# Patient Record
Sex: Male | Born: 1990 | Race: White | Hispanic: No | Marital: Single | Smoking: Current every day smoker
Health system: Southern US, Community
[De-identification: ages and names within clinical notes are randomized; demographics above are authoritative.]

---
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
## 2015-02-12 ENCOUNTER — Encounter (HOSPITAL_COMMUNITY): Payer: Self-pay

## 2015-02-12 ENCOUNTER — Emergency Department (HOSPITAL_COMMUNITY)
Admission: EM | Admit: 2015-02-12 | Discharge: 2015-02-12 | Disposition: A | Discharge status: Home / Self Care | Payer: Self-pay | Attending: Emergency Medicine | Admitting: Emergency Medicine

## 2015-02-12 DIAGNOSIS — Z720 Tobacco use: Secondary | ICD-10-CM | POA: Insufficient documentation

## 2015-02-12 DIAGNOSIS — Y9389 Activity, other specified: Secondary | ICD-10-CM | POA: Insufficient documentation

## 2015-02-12 DIAGNOSIS — T7840XA Allergy, unspecified, initial encounter: Secondary | ICD-10-CM

## 2015-02-12 DIAGNOSIS — T63441A Toxic effect of venom of bees, accidental (unintentional), initial encounter: Secondary | ICD-10-CM | POA: Insufficient documentation

## 2015-02-12 DIAGNOSIS — Y9289 Other specified places as the place of occurrence of the external cause: Secondary | ICD-10-CM | POA: Insufficient documentation

## 2015-02-12 DIAGNOSIS — Y998 Other external cause status: Secondary | ICD-10-CM | POA: Insufficient documentation

## 2015-02-12 MED ORDER — FAMOTIDINE IN NACL 20-0.9 MG/50ML-% IV SOLN
20.0000 mg | Freq: Once | INTRAVENOUS | Status: AC
  Administered 2015-02-12: 20 mg via INTRAVENOUS
  Filled 2015-02-12: qty 50

## 2015-02-12 MED ORDER — DIPHENHYDRAMINE HCL 50 MG/ML IJ SOLN
25.0000 mg | Freq: Once | INTRAMUSCULAR | Status: AC
  Administered 2015-02-12: 25 mg via INTRAVENOUS
  Filled 2015-02-12: qty 1

## 2015-02-12 MED ORDER — METHYLPREDNISOLONE SODIUM SUCC 125 MG IJ SOLR
125.0000 mg | Freq: Once | INTRAMUSCULAR | Status: AC
  Administered 2015-02-12: 125 mg via INTRAVENOUS
  Filled 2015-02-12: qty 2

## 2015-02-12 NOTE — ED Provider Notes (Signed)
CSN: [id number]     Arrival date & time 02/12/15  1919 History   First MD Initiated Contact with Patient 02/12/15 1935     Chief Complaint  Patient presents with  . Allergic Reaction      HPI  Vision presents for evaluation after a bee sting. He was stung on his head, and his left hand 30 this report arrival. States he felt swelling in his upper lip.  Some itching in his left hand. No difficulty swallowing or breathing and no stridor. Not dizzy or lightheaded. His never had significant hymenoptera evenomation or anaphylaxis.  History reviewed. No pertinent past medical history. History reviewed. No pertinent past surgical history. No family history on file. History  Substance Use Topics  . Smoking status: Current Every Day Smoker  . Smokeless tobacco: Not on file  . Alcohol Use: Yes    Review of Systems  Constitutional: Negative for fever, chills, diaphoresis, appetite change and fatigue.  HENT: Negative for mouth sores, sore throat and trouble swallowing.        No obvious asymmetry of the upper and lower lip. No swelling. Tongue normal pharynx normal to clear lungs no stridor.  Eyes: Negative for visual disturbance.  Respiratory: Negative for cough, chest tightness, shortness of breath and wheezing.   Cardiovascular: Negative for chest pain.  Gastrointestinal: Negative for nausea, vomiting, abdominal pain, diarrhea and abdominal distention.  Endocrine: Negative for polydipsia, polyphagia and polyuria.  Genitourinary: Negative for dysuria, frequency and hematuria.  Musculoskeletal: Negative for gait problem.  Skin: Negative for color change, pallor and rash.       Bee stings to the left hand. Minimal erythema.  Neurological: Negative for dizziness, syncope, light-headedness and headaches.  Hematological: Does not bruise/bleed easily.  Psychiatric/Behavioral: Negative for behavioral problems and confusion.      Allergies  Bee venom  Home Medications   Prior to Admission  medications   Not on File   BP 131/80 mmHg  Pulse 70  Temp(Src) 98 F (36.7 C) (Oral)  Resp 15  Ht 6\' 1"  (1.854 m)  Wt 152 lb (68.947 kg)  BMI 20.06 kg/m2  SpO2 98% Physical Exam  Constitutional: He is oriented to person, place, and time. He appears well-developed and well-nourished. No distress.  HENT:  Head: Normocephalic.  Normal lip, normal pharynx.  Eyes: Conjunctivae are normal. Pupils are equal, round, and reactive to light. No scleral icterus.  Neck: Normal range of motion. Neck supple. No thyromegaly present.  Cardiovascular: Normal rate and regular rhythm.  Exam reveals no gallop and no friction rub.   No murmur heard. Pulmonary/Chest: Effort normal and breath sounds normal. No respiratory distress. He has no wheezes. He has no rales.  Abdominal: Soft. Bowel sounds are normal. He exhibits no distension. There is no tenderness. There is no rebound.  Musculoskeletal: Normal range of motion.  Neurological: He is alert and oriented to person, place, and time.  Skin: Skin is warm and dry. No rash noted.  Psychiatric: He has a normal mood and affect. His behavior is normal.    ED Course  Procedures (including critical care time) Labs Review Labs Reviewed - No data to display  Imaging Review No results found.   EKG Interpretation None      MDM   Final diagnoses:  Allergic reaction, initial encounter   IV placed, patient given meds. On reevaluation he is asymptomatic. Normal airway and oropharynx. No stridor clear lungs no rash. Discharge home. Continue Benadryl when necessary.  Rolland Porter, MD 02/12/15 [phone]

## 2015-02-12 NOTE — Discharge Instructions (Signed)
Benadryl as needed for the next 24 hours. Turn to your with difficulty breathing, swallowing, speaking or other new or worsening symptoms.

## 2015-02-12 NOTE — ED Notes (Signed)
Pt states he was stung 3 times by wasps approx 30 mins ago, states he is swelling in the face and lips, but denies throat swelling.

## 2015-02-12 NOTE — ED Notes (Signed)
Pt alert & oriented x4, stable gait. Patient given discharge instructions, paperwork & prescription(s). Patient  instructed to stop at the registration desk to finish any additional paperwork. Patient verbalized understanding. Pt left department w/ no further questions. 

## 2016-02-24 ENCOUNTER — Encounter (HOSPITAL_COMMUNITY): Payer: Self-pay | Admitting: Emergency Medicine

## 2016-02-24 ENCOUNTER — Emergency Department (HOSPITAL_COMMUNITY)
Admission: EM | Admit: 2016-02-24 | Discharge: 2016-02-24 | Disposition: A | Discharge status: Home / Self Care | Payer: Self-pay | Attending: Emergency Medicine | Admitting: Emergency Medicine

## 2016-02-24 ENCOUNTER — Emergency Department (HOSPITAL_COMMUNITY)
Admit: 2016-02-24 | Discharge: 2016-02-24 | Disposition: A | Discharge status: Home / Self Care | Payer: Self-pay | Attending: Physician Assistant | Admitting: Physician Assistant

## 2016-02-24 DIAGNOSIS — R1011 Right upper quadrant pain: Secondary | ICD-10-CM | POA: Insufficient documentation

## 2016-02-24 DIAGNOSIS — F17210 Nicotine dependence, cigarettes, uncomplicated: Secondary | ICD-10-CM | POA: Insufficient documentation

## 2016-02-24 LAB — CBC
HCT: 48.1 % (ref 39.0–52.0)
Hemoglobin: 16.7 g/dL (ref 13.0–17.0)
MCH: 33.9 pg (ref 26.0–34.0)
MCHC: 34.7 g/dL (ref 30.0–36.0)
MCV: 97.8 fL (ref 78.0–100.0)
PLATELETS: 233 10*3/uL (ref 150–400)
RBC: 4.92 MIL/uL (ref 4.22–5.81)
RDW: 12.6 % (ref 11.5–15.5)
WBC: 8 10*3/uL (ref 4.0–10.5)

## 2016-02-24 LAB — URINALYSIS, ROUTINE W REFLEX MICROSCOPIC
BILIRUBIN URINE: NEGATIVE
Glucose, UA: NEGATIVE mg/dL
Hgb urine dipstick: NEGATIVE
KETONES UR: NEGATIVE mg/dL
Leukocytes, UA: NEGATIVE
NITRITE: NEGATIVE
PH: 7 (ref 5.0–8.0)
Protein, ur: NEGATIVE mg/dL
Specific Gravity, Urine: 1.01 (ref 1.005–1.030)

## 2016-02-24 LAB — LIPASE, BLOOD: Lipase: 19 U/L (ref 11–51)

## 2016-02-24 LAB — COMPREHENSIVE METABOLIC PANEL
ALBUMIN: 4.9 g/dL (ref 3.5–5.0)
ALK PHOS: 64 U/L (ref 38–126)
ALT: 11 U/L — ABNORMAL LOW (ref 17–63)
AST: 19 U/L (ref 15–41)
Anion gap: 2 — ABNORMAL LOW (ref 5–15)
BILIRUBIN TOTAL: 0.5 mg/dL (ref 0.3–1.2)
BUN: 11 mg/dL (ref 6–20)
CALCIUM: 9.8 mg/dL (ref 8.9–10.3)
CO2: 31 mmol/L (ref 22–32)
CREATININE: 0.92 mg/dL (ref 0.61–1.24)
Chloride: 102 mmol/L (ref 101–111)
GFR calc Af Amer: >60 mL/min (ref >60)
GFR calc non Af Amer: >60 mL/min (ref >60)
GLUCOSE: 148 mg/dL — AB (ref 65–99)
Potassium: 4.1 mmol/L (ref 3.5–5.1)
Sodium: 135 mmol/L (ref 135–145)
TOTAL PROTEIN: 7.9 g/dL (ref 6.5–8.1)

## 2016-02-24 MED ORDER — RANITIDINE HCL 150 MG PO TABS: 150.0000 mg | ORAL_TABLET | Freq: Two times a day (BID) | ORAL | 0 refills | Status: AC

## 2016-02-24 MED ORDER — OMEPRAZOLE 20 MG PO CPDR: 20.0000 mg | DELAYED_RELEASE_CAPSULE | Freq: Every day | ORAL | 1 refills | Status: AC

## 2016-02-24 NOTE — ED Notes (Signed)
Pt went over for US.

## 2016-02-24 NOTE — ED Triage Notes (Signed)
Pt reports RUQ pain since last Sunday with nausea after eating, denies v/d.  Pt drinks appx 30 beers every weekend and occasionally has tremors and headache during the week when he does not drink.

## 2016-02-24 NOTE — ED Provider Notes (Addendum)
[facility]-EMERGENCY DEPT Provider Note   CSN: [id number] Arrival date & time: 02/24/16  0847     History   Chief Complaint Chief Complaint  Patient presents with  . Abdominal Pain    HPI Paul Weber is a 25 y.o. male.  Patient is a 25 year old male who presents to the emergency department with a complaint of right upper quadrant abdominal pain.  The patient states this problem started nearly a week ago. He states the pain is mostly at night, it last about 15-30 minutes. The patient seems to be worse with eating. Nothing seems to make the pain any better. There's been no recent injury, operation, or procedure involving the right upper quadrant. The patient is not had this problem in the past. He is not had any vomiting, but has had some nausea. He's had some constipation, but no diarrhea. There's been no fever, there's been no blood in the urine, this been no blood in the stools. The patient states that he is a smoker, he drinks beer 2 times a week, but states he drinks probably 15 beers a day during those 2 days. He denies use of any illicit drugs.    Abdominal Pain      History reviewed. No pertinent past medical history.  There are no active problems to display for this patient.   History reviewed. No pertinent surgical history.     Home Medications    Prior to Admission medications   Not on File    Family History History reviewed. No pertinent family history.  Social History Social History  Substance Use Topics  . Smoking status: Current Every Day Smoker    Packs/day: 1.00  . Smokeless tobacco: Never Used  . Alcohol use Yes     Comment: 30 beers every weekend      Allergies   Bee venom   Review of Systems Review of Systems  Gastrointestinal: Positive for abdominal pain.  All other systems reviewed and are negative.    Physical Exam Updated Vital Signs BP 153/92 (BP Location: Left Arm)   Pulse 94   Temp 97.9 F (36.6 C) (Oral)   Resp 18    Ht 6\' 1"  (1.854 m)   Wt 70.3 kg   SpO2 100%   BMI 20.45 kg/m   Physical Exam  Constitutional: He appears well-developed and well-nourished. No distress.  HENT:  Head: Normocephalic and atraumatic.  Mouth/Throat: Oropharynx is clear and moist. No oropharyngeal exudate.  Eyes: Conjunctivae and EOM are normal. Pupils are equal, round, and reactive to light. Right eye exhibits no discharge. Left eye exhibits no discharge. No scleral icterus.  Neck: Normal range of motion. Neck supple. No JVD present. No thyromegaly present.  Cardiovascular: Normal rate, regular rhythm, normal heart sounds and intact distal pulses.  Exam reveals no gallop and no friction rub.   No murmur heard. Pulmonary/Chest: Effort normal and breath sounds normal. No respiratory distress. He has no wheezes. He has no rales.  Abdominal: Soft. Bowel sounds are normal. He exhibits no distension and no mass. There is no tenderness.  Musculoskeletal: Normal range of motion. He exhibits no edema or tenderness.  Lymphadenopathy:    He has no cervical adenopathy.  Neurological: He is alert. Coordination normal.  Skin: Skin is warm and dry. No rash noted. No erythema.  Psychiatric: He has a normal mood and affect. His behavior is normal.  Nursing note and vitals reviewed.    ED Treatments / Results  Labs (all labs ordered are  listed, but only abnormal results are displayed) Labs Reviewed  COMPREHENSIVE METABOLIC PANEL - Abnormal; Notable for the following:       Result Value   Glucose, Bld 148 (*)    ALT 11 (*)    Anion gap 2 (*)    All other components within normal limits  LIPASE, BLOOD  CBC  URINALYSIS, ROUTINE W REFLEX MICROSCOPIC (NOT AT [facility])    EKG  EKG Interpretation None       Radiology No results found.  Procedures Procedures (including critical care time)  Medications Ordered in ED Medications - No data to display   Initial Impression / Assessment and Plan / ED Course  I have reviewed the  triage vital signs and the nursing notes.  Pertinent labs & imaging results that were available during my care of the patient were reviewed by me and considered in my medical decision making (see chart for details).  Clinical Course  Comment By Time  The patient has a history of alcohol intake, he states after he drinks alcohol the next morning he has a burning sensation in the right upper quadrant. He has nausea with this, sometimes she has nausea after eating in the right upper quadrant, at this time he has no tenderness on my exam, his vital signs are normal, he is no longer tachycardic, his labs are unremarkable with no leukocytosis, normal liver function, normal lipase and a clean urinalysis. Ultrasound of the right upper quadrant was performed and shows no signs of cholecystitis. The patient was informed of all of these results by myself, he is stable for discharge. He is agreeable to decrease his drinking, we will start on antihistamine Eber HongBrian Sharmain Lastra, MD 08/20 1125    **I have reviewed nursing notes, vital signs, and all appropriate lab and imaging results for this patient.*  Final Clinical Impressions(s) / ED Diagnoses  Vital signs within normal limits with exception of blood pressure being 116/99. Urinalysis is within normal limits. Competence of metabolic panel shows the glucose to be 148, otherwise within normal limits. The lipase is normal at 19, and the complete blood count is well within normal limits. The abdominal ultrasound is negative for acute event.  The patient will be placed on Zantac. He is referred to gastroenterology for additional evaluation concerning his abdominal pain. Test results discussed with the patient by Dr. Hyacinth MeekerMiller . Patient will return sooner if any changes or problems.    Final diagnoses:  None    New Prescriptions New Prescriptions   No medications on file      Ivery QualeHobson Bryant, PA-C 02/24/16 1701    Eber HongBrian Tadhg Eskew, MD 02/25/16 [id number]    Eber HongBrian Oluwatobiloba Martin,  MD 03/26/16 1021

## 2016-02-24 NOTE — Discharge Instructions (Addendum)
Your vital signs within normal limits, with exception of your blood pressure being 153/92. Your oxygen level is 100% on room air. Your urine test is negative for acute problem. Your lab work is negative for acute issue. Please call Dr. Darrick Pennafields, stomach specialist, for additional evaluation of your right upper quadrant pain. ;Your ultrasound test is negative.

## 2017-02-15 IMAGING — US US ABDOMEN LIMITED
1 series · 14 of 25 positions shown · non-contrast
Comparison: None.

CLINICAL DATA: Right upper quadrant pain, 1 week duration.

EXAM:
US ABDOMEN LIMITED - RIGHT UPPER QUADRANT

[Series 1: us abdomen limited · 0.15mm/px · 14 of 54 slices shown]
[im 1/54]
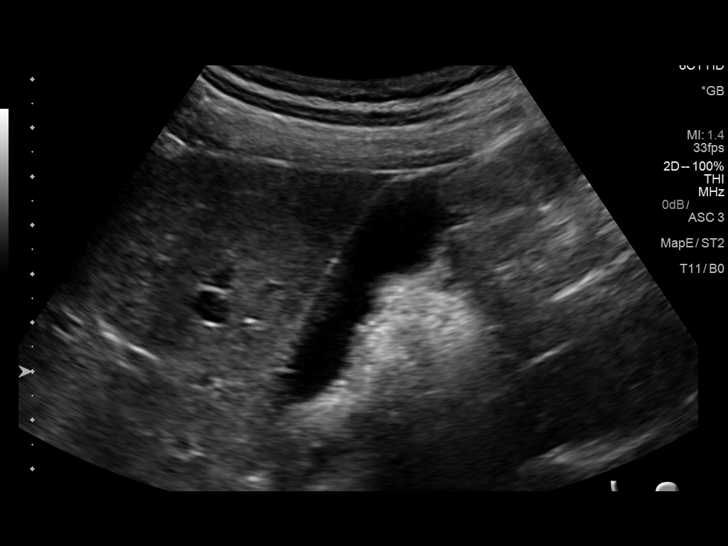
[im 5/54]
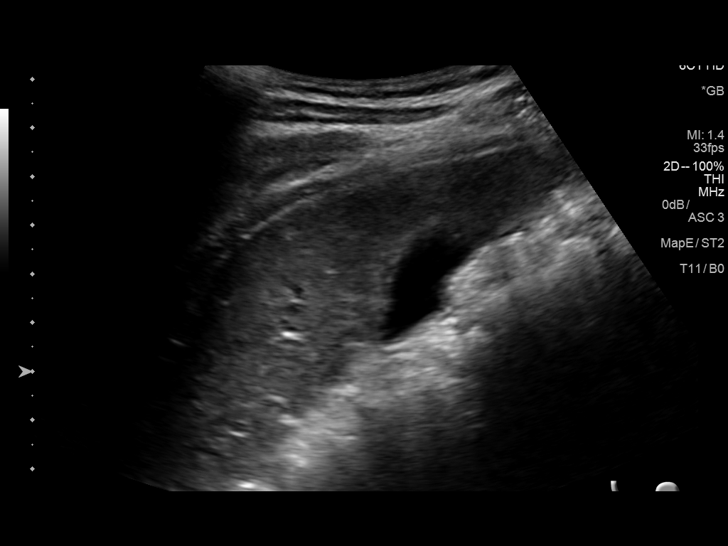
[im 9/54]
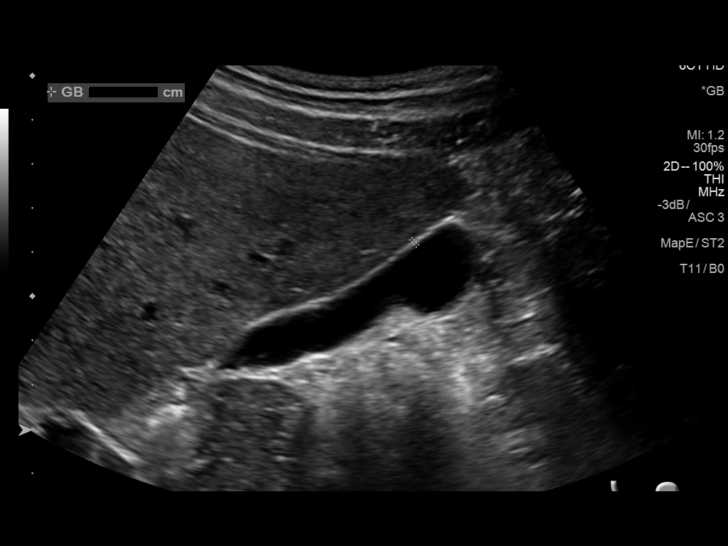
[im 14/54]
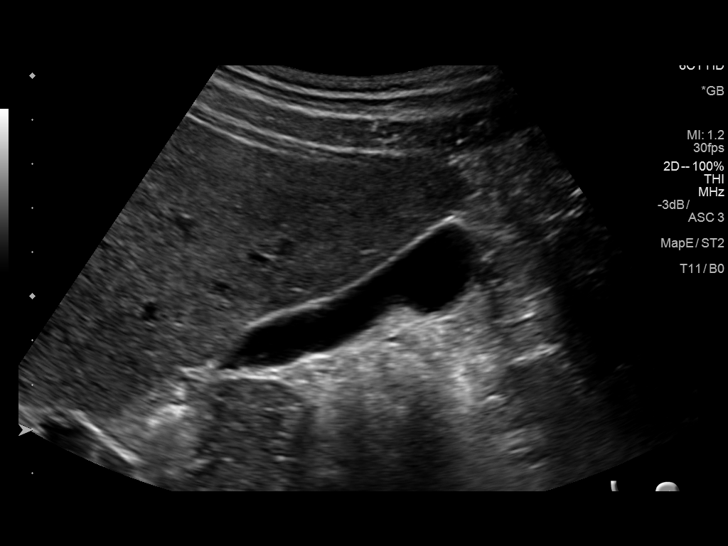
[im 18/54]
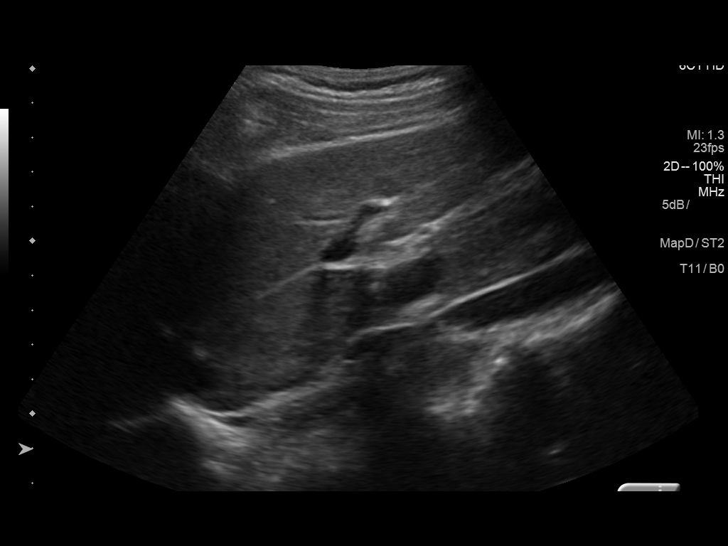
[im 20/54]
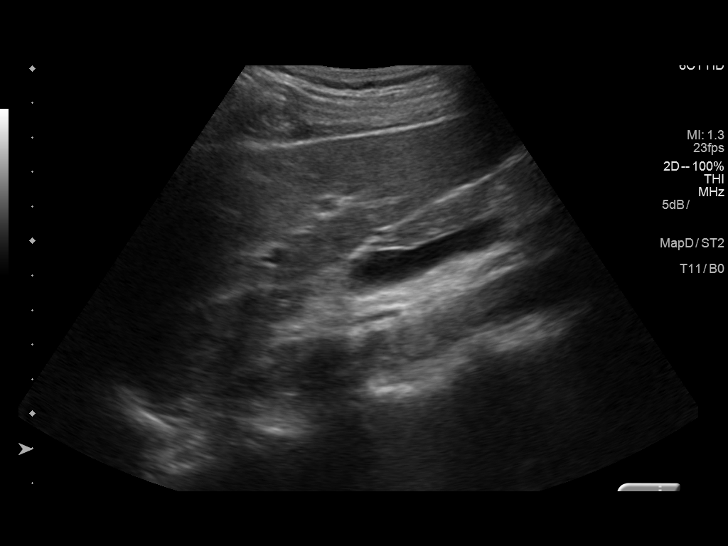
[im 25/54]
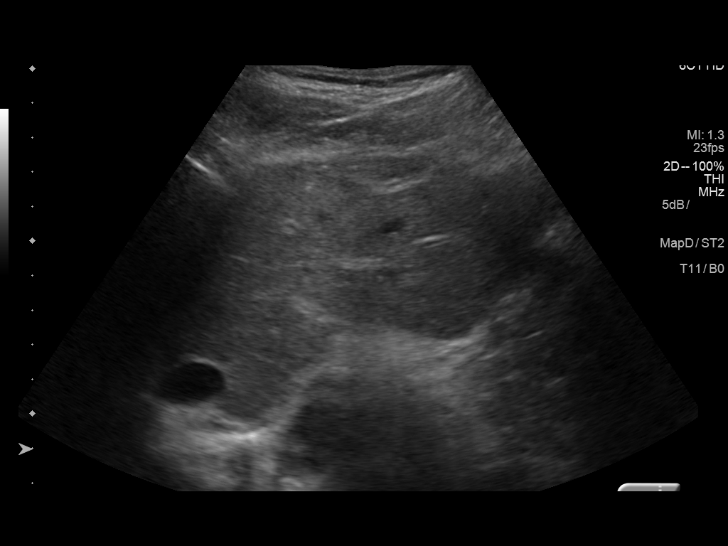
[im 29/54]
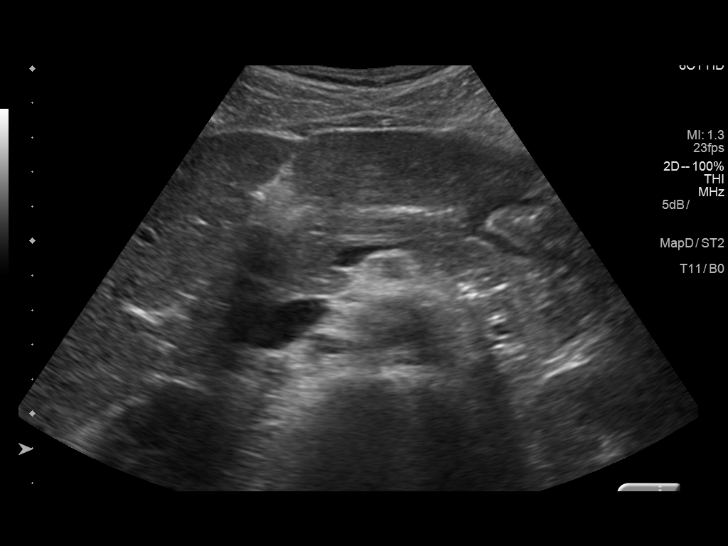
[im 34/54]
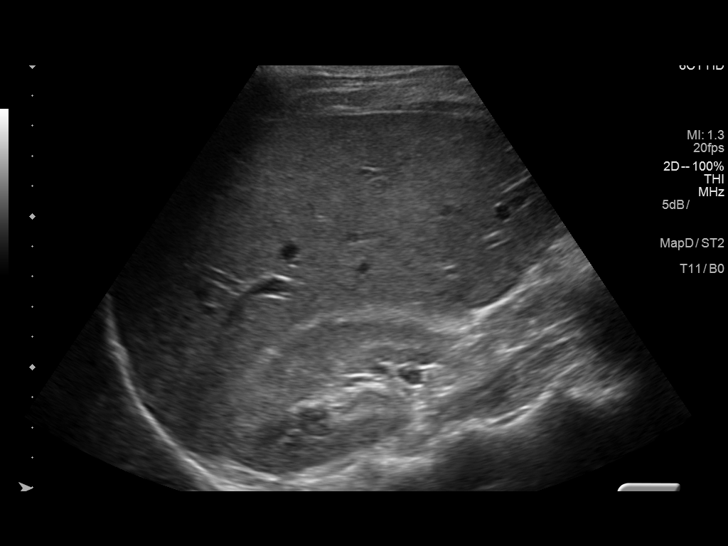
[im 36/54]
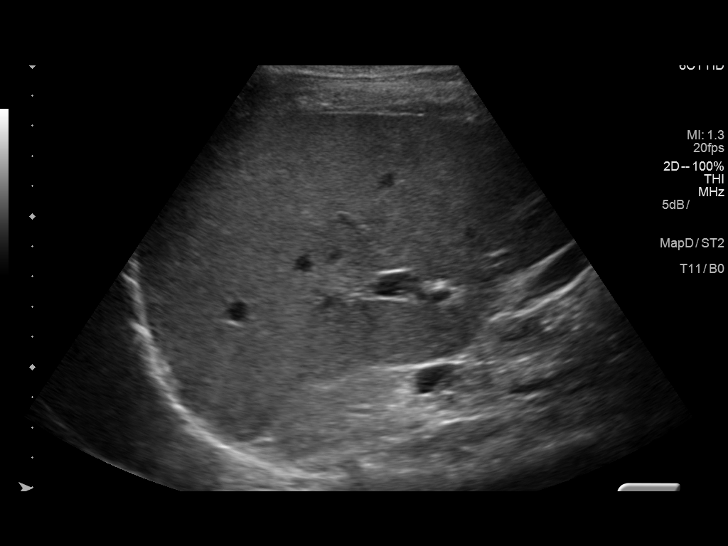
[im 40/54]
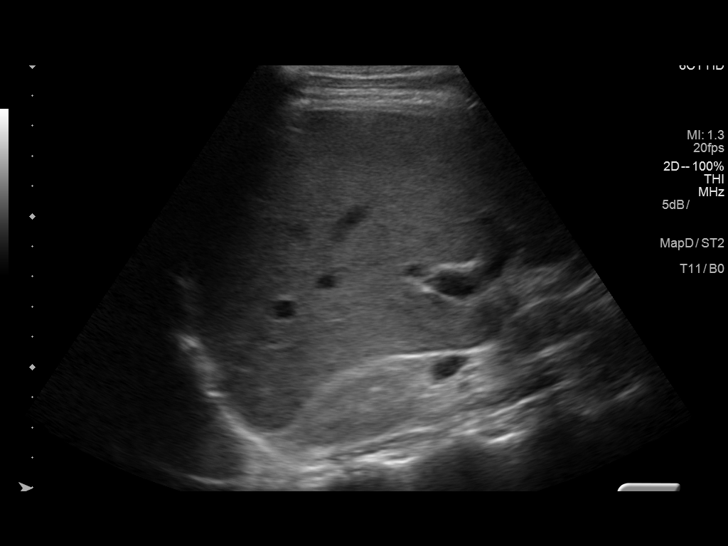
[im 45/54]
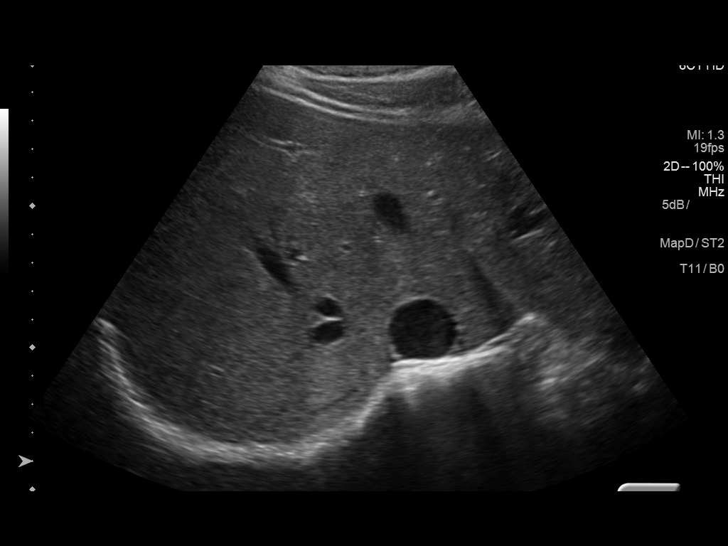
[im 49/54]
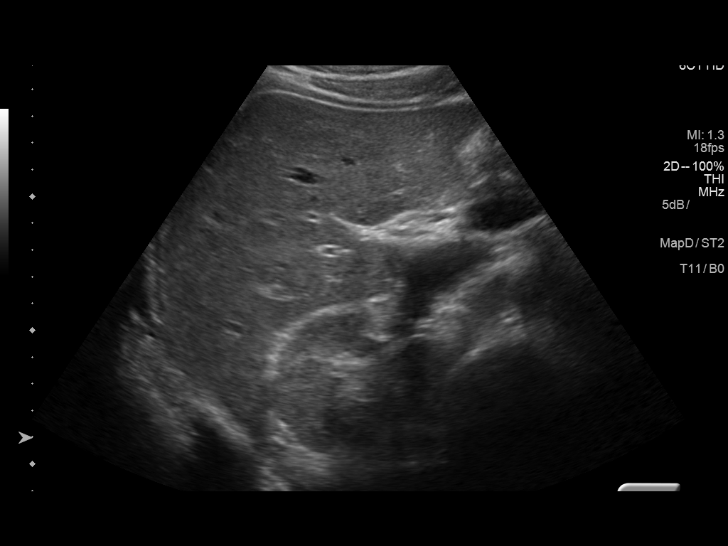
[im 54/54]
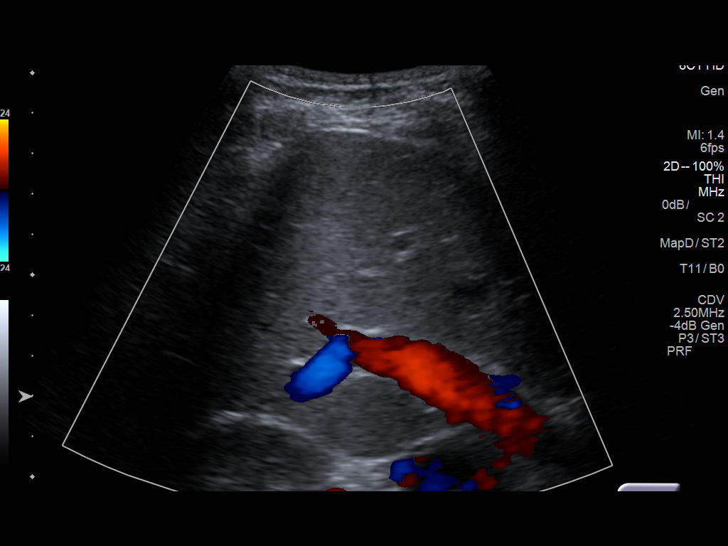

[14 of 25 positions shown; findings below may reference images not displayed]

FINDINGS: Gallbladder:

No gallstones or wall thickening visualized. No sonographic Murphy
sign noted by sonographer.

Common bile duct:

Diameter: 3 mm, normal

Liver:

Normal echogenicity.  No focal lesions.  No ductal dilatation.
IMPRESSION: Normal examination. No abnormality seen to explain right upper
quadrant pain.
